# Patient Record
Sex: Female | Born: 1980 | Race: White | Hispanic: No | Marital: Married | Smoking: Never smoker
Health system: Southern US, Community
[De-identification: ages and names within clinical notes are randomized; demographics above are authoritative.]

## PROBLEM LIST (undated history)

## (undated) ENCOUNTER — Inpatient Hospital Stay (HOSPITAL_COMMUNITY): Payer: Self-pay

## (undated) DIAGNOSIS — R87619 Unspecified abnormal cytological findings in specimens from cervix uteri: Secondary | ICD-10-CM

## (undated) DIAGNOSIS — Z8619 Personal history of other infectious and parasitic diseases: Secondary | ICD-10-CM

## (undated) DIAGNOSIS — IMO0002 Reserved for concepts with insufficient information to code with codable children: Secondary | ICD-10-CM

## (undated) DIAGNOSIS — E7211 Homocystinuria: Secondary | ICD-10-CM

## (undated) DIAGNOSIS — E7212 Methylenetetrahydrofolate reductase deficiency: Secondary | ICD-10-CM

## (undated) HISTORY — PX: COLPOSCOPY: SHX161

## (undated) HISTORY — DX: Unspecified abnormal cytological findings in specimens from cervix uteri: R87.619

## (undated) HISTORY — DX: Personal history of other infectious and parasitic diseases: Z86.19

## (undated) HISTORY — DX: Reserved for concepts with insufficient information to code with codable children: IMO0002

---

## 2006-02-06 ENCOUNTER — Other Ambulatory Visit: Admission: RE | Admit: 2006-02-06 | Discharge: 2006-02-06 | Payer: Self-pay | Admitting: Gynecology

## 2008-05-15 ENCOUNTER — Inpatient Hospital Stay (HOSPITAL_COMMUNITY): Admission: AD | Admit: 2008-05-15 | Discharge: 2008-05-18 | Payer: Self-pay | Admitting: Obstetrics & Gynecology

## 2010-03-08 ENCOUNTER — Inpatient Hospital Stay (HOSPITAL_COMMUNITY): Admission: AD | Admit: 2010-03-08 | Discharge: 2010-03-09 | Payer: Self-pay | Admitting: Obstetrics and Gynecology

## 2010-03-19 ENCOUNTER — Inpatient Hospital Stay (HOSPITAL_COMMUNITY): Admission: AD | Admit: 2010-03-19 | Discharge: 2010-03-21 | Payer: Self-pay | Admitting: Obstetrics and Gynecology

## 2010-09-12 LAB — CBC
HCT: 33 % — ABNORMAL LOW (ref 36.0–46.0)
HCT: 36.1 % (ref 36.0–46.0)
Hemoglobin: 11.3 g/dL — ABNORMAL LOW (ref 12.0–15.0)
Hemoglobin: 12.1 g/dL (ref 12.0–15.0)
MCH: 30.6 pg (ref 26.0–34.0)
MCH: 31.4 pg (ref 26.0–34.0)
MCHC: 33.4 g/dL (ref 30.0–36.0)
MCHC: 34.2 g/dL (ref 30.0–36.0)
MCV: 91.7 fL (ref 78.0–100.0)
Platelets: 131 10*3/uL — ABNORMAL LOW (ref 150–400)
RBC: 3.6 MIL/uL — ABNORMAL LOW (ref 3.87–5.11)
RDW: 13.2 % (ref 11.5–15.5)
RDW: 13.4 % (ref 11.5–15.5)
WBC: 12.5 10*3/uL — ABNORMAL HIGH (ref 4.0–10.5)

## 2010-11-12 NOTE — H&P (Signed)
Hannah Holmes, Hannah Holmes                 ACCOUNT NO.:  [id number]   MEDICAL RECORD NO.:  [id number]         PATIENT TYPE:  INP   LOCATION:                                FACILITY:  [facility]   PHYSICIAN:  Freddy Finner, M.D.   DATE OF BIRTH:  Jun 11, 1981   DATE OF ADMISSION:  05/15/2008  DATE OF DISCHARGE:                              HISTORY & PHYSICAL   ADMISSION DIAGNOSIS:  1. Intrauterine pregnancy 39-3/[redacted] weeks gestation.  2. Pregnancy-induced hypertension.   The patient is a 30 year old white married female primigravida whose  prenatal course has been essentially uneventful until November 13 at  which time she was found to have an elevation of her blood pressure  138/90.  She had 1+ proteinuria.  PIH labs obtained on that day showed  only a slight elevation of uric acid and a slight decrease in albumin to  3.4.  When she presented to the office again today her blood pressure is  140/106 with 1+ proteinuria.  Her review of systems was negative but she  has a relatively favorable cervix and clearly has early pregnancy-  induced hypertension.  She is admitted now for delivery.   REVIEW OF SYSTEMS:  She specifically denies any cardiopulmonary or GI  symptoms.  On bed rest over the weekend she lost 4 pounds.  Her prenatal  is otherwise unremarkable.  Her strep culture is negative.   PAST MEDICAL HISTORY:  Family history recorded in detail in the house  [place] form and will not be repeated.   PHYSICAL EXAMINATION:  HEENT: Is grossly within normal limits.  CONSTITUTIONAL:  The patient is alert, oriented, cooperative and has no  complaints.  VITAL SIGNS:  Blood pressure in the office 140/106.  NEUROLOGIC:  Deep tendon reflexes +1.  HEART:  Normal sinus rhythm with an early systolic murmur consistent  with a flow murmur grade 2/6.  CHEST:  Clear to auscultation throughout.  ABDOMEN:  Gravid.  Fundal height 39 cm.  Palpation of the upper abdomen  is remarkable for no evidence of  organomegaly or palpable masses or  tenderness or pain.  EXTREMITIES:  +1 edema.  PELVIC:  Exam of the cervix is long, dilated 1 to 1-1/2 of the internal  os.  Vertex is presenting at a -3 station.   DIAGNOSES:  Intrauterine pregnancy at term, pregnancy-induced  hypertension.   PLAN:  Admission for observation and augmentation of labor to delivery.      Freddy Finner, M.D.  Electronically Signed    WRN/MEDQ  D:  05/15/2008  T:  05/15/2008  Job:  [id number]

## 2011-04-02 LAB — COMPREHENSIVE METABOLIC PANEL
ALT: 17
ALT: 19
AST: 23
AST: 31
Albumin: 3 — ABNORMAL LOW
CO2: 18 — ABNORMAL LOW
Calcium: 9.2
Chloride: 106
Creatinine, Ser: 0.67
Creatinine, Ser: 0.73
GFR calc Af Amer: >60
GFR calc Af Amer: >60
GFR calc non Af Amer: >60
GFR calc non Af Amer: >60
Glucose, Bld: 118 — ABNORMAL HIGH
Sodium: 136
Total Bilirubin: 0.7
Total Protein: 6.3

## 2011-04-02 LAB — CBC
Hemoglobin: 12.1
Hemoglobin: 12.1
MCHC: 33.2
MCHC: 33.5
MCHC: 33.8
MCV: 89.6
MCV: 90.7
Platelets: 181
Platelets: 213
RBC: 4.05
RDW: 14.2
WBC: 15.4 — ABNORMAL HIGH
WBC: 20.9 — ABNORMAL HIGH

## 2011-04-02 LAB — RPR: RPR Ser Ql: NONREACTIVE

## 2011-04-02 LAB — LACTATE DEHYDROGENASE: LDH: 170

## 2011-05-07 ENCOUNTER — Other Ambulatory Visit: Payer: Self-pay

## 2011-05-07 ENCOUNTER — Emergency Department (HOSPITAL_COMMUNITY)
Admission: EM | Admit: 2011-05-07 | Discharge: 2011-05-07 | Disposition: A | Discharge status: Home / Self Care | Payer: BC Managed Care – PPO | Attending: Emergency Medicine | Admitting: Emergency Medicine

## 2011-05-07 ENCOUNTER — Encounter: Payer: Self-pay | Admitting: *Deleted

## 2011-05-07 DIAGNOSIS — R000 Tachycardia, unspecified: Secondary | ICD-10-CM | POA: Insufficient documentation

## 2011-05-07 DIAGNOSIS — R209 Unspecified disturbances of skin sensation: Secondary | ICD-10-CM | POA: Insufficient documentation

## 2011-05-07 DIAGNOSIS — F411 Generalized anxiety disorder: Secondary | ICD-10-CM | POA: Insufficient documentation

## 2011-05-07 DIAGNOSIS — X58XXXA Exposure to other specified factors, initial encounter: Secondary | ICD-10-CM | POA: Insufficient documentation

## 2011-05-07 DIAGNOSIS — R404 Transient alteration of awareness: Secondary | ICD-10-CM | POA: Insufficient documentation

## 2011-05-07 DIAGNOSIS — R42 Dizziness and giddiness: Secondary | ICD-10-CM | POA: Insufficient documentation

## 2011-05-07 DIAGNOSIS — R55 Syncope and collapse: Secondary | ICD-10-CM

## 2011-05-07 DIAGNOSIS — R61 Generalized hyperhidrosis: Secondary | ICD-10-CM | POA: Insufficient documentation

## 2011-05-07 DIAGNOSIS — R110 Nausea: Secondary | ICD-10-CM | POA: Insufficient documentation

## 2011-05-07 DIAGNOSIS — H539 Unspecified visual disturbance: Secondary | ICD-10-CM | POA: Insufficient documentation

## 2011-05-07 DIAGNOSIS — IMO0002 Reserved for concepts with insufficient information to code with codable children: Secondary | ICD-10-CM | POA: Insufficient documentation

## 2011-05-07 LAB — GLUCOSE, CAPILLARY: Glucose-Capillary: 141 mg/dL — ABNORMAL HIGH (ref 70–99)

## 2011-05-07 NOTE — ED Notes (Signed)
EMS-pt had episode of syncope after hyperventilating at work. Alert and oriented x 3. Pt head nose, small abrasion noted.

## 2011-05-07 NOTE — ED Provider Notes (Signed)
History     CSN: [id number] Arrival date & time: 05/07/2011 10:30 AM   First MD Initiated Contact with Patient 05/07/11 1035      Chief Complaint  Patient presents with  . Loss of Consciousness    (Consider location/radiation/quality/duration/timing/severity/associated sxs/prior treatment) Patient is a 30 y.o. female presenting with syncope. The history is provided by the patient.  Loss of Consciousness This is a new problem. The current episode started today. Associated symptoms include diaphoresis, nausea, numbness and a visual change. Pertinent negatives include no abdominal pain, anorexia, arthralgias, change in bowel habit, chest pain, chills, congestion, coughing, fatigue, fever, headaches, neck pain, rash, sore throat, swollen glands, urinary symptoms, vertigo, vomiting or weakness. She has tried eating for the symptoms. The treatment provided moderate relief.   Patient was at work this morning when she had a brief episode of syncope. She states that she did not eat breakfast this morning because she was feeling nauseated. She began to feel dizzy, got tingling in her extremities, noticed clammy palms, and then noticed her vision was closing in. She then lost consciousness for a few seconds. She came back to and then had any another brief episode of syncope which lasted another few seconds. She then ate a few bites of a pear and some glucose paste and began to feel better. She was alert and oriented immediately after the event, and witnesses did not notice any seizure like movements during the event. She was a little shaky after the event. EMS was called and she was brought to the ED for further evaluation. CBG by EMS was 87. She has not had any problems with syncope in the past. She denies any associated chest pain or palpitations. She does recall having some slight nausea with the event, but is now resolved.   She is not currently pregnant. Denies CP, leg swelling, recent travel, recent  surgery, recent immobilization, use of OCPs, personal or family hx of PE/DVT.  No past medical history on file.  No past surgical history on file.  No family history on file.  History  Substance Use Topics  . Smoking status: Never Smoker   . Smokeless tobacco: Never Used  . Alcohol Use: Yes    OB History    Grav Para Term Preterm Abortions TAB SAB Ect Mult Living                  Review of Systems  Constitutional: Positive for diaphoresis. Negative for fever, chills, activity change, appetite change and fatigue.  HENT: Negative for congestion, sore throat and neck pain.   Eyes: Negative for photophobia and visual disturbance.  Respiratory: Negative for cough.   Cardiovascular: Positive for syncope. Negative for chest pain.  Gastrointestinal: Positive for nausea. Negative for vomiting, abdominal pain, anorexia and change in bowel habit.  Musculoskeletal: Negative for arthralgias.  Skin: Negative for rash.  Neurological: Positive for syncope and numbness. Negative for dizziness, vertigo, seizures, speech difficulty, weakness, light-headedness and headaches.  Psychiatric/Behavioral: The patient is not nervous/anxious.     Allergies  Review of patient's allergies indicates no known allergies.  Home Medications  No current outpatient prescriptions on file.  Pulse 90  Temp(Src) 98.2 F (36.8 C) (Oral)  Resp 12  SpO2 95%  Physical Exam  Constitutional: She is oriented to person, place, and time. She appears well-developed and well-nourished. No distress.  HENT:  Head: Normocephalic and atraumatic.  Right Ear: External ear normal.  Left Ear: External ear normal.  Nose: Nose normal.  Mouth/Throat: Oropharynx is clear and moist. No oropharyngeal exudate.       Small abrasion to bridge of nose  Eyes: Conjunctivae and EOM are normal. Pupils are equal, round, and reactive to light.  Neck: Normal range of motion. Neck supple.  Cardiovascular: Normal rate, regular rhythm and  normal heart sounds.  Exam reveals no gallop and no friction rub.   No murmur heard. Pulmonary/Chest: Effort normal and breath sounds normal. No respiratory distress. She exhibits no tenderness.  Abdominal: Soft. There is no tenderness. There is no rebound and no guarding.  Musculoskeletal: Normal range of motion.  Neurological: She is alert and oriented to person, place, and time. No cranial nerve deficit. Coordination normal.  Skin: Skin is warm and dry. No rash noted. She is not diaphoretic.  Psychiatric: She has a normal mood and affect.    ED Course  Procedures (including critical care time)  11:20 AM Patient resting comfortably at this time, states symptoms are resolved - not having any more shakiness. She has been eating crackers and drinking Coke. She feels a little bit anxious and is slightly tachy to 105 but attributes that to the caffeine in the Coke. Denies headache, dizziness. Await urine pregnancy test.   Date: 05/07/2011  Rate: 108  Rhythm: sinus tachycardia  QRS Axis: normal  Intervals: normal  ST/T Wave abnormalities: normal  Conduction Disutrbances:none  Narrative Interpretation:   Old EKG Reviewed: none available    Labs Reviewed  GLUCOSE, CAPILLARY - Abnormal; Notable for the following:    Glucose-Capillary 141 (*)    All other components within normal limits  POCT PREGNANCY, URINE  POCT CBG MONITORING  POCT PREGNANCY, URINE   No results found.   1. Syncope       MDM  This sounds as if it may have been a vasovagal episode possibly mediated by hypoglycemia. Her sx have resolved while in the dept. She has been able to take PO well without n/v. Her CBG in the dept was nl after eating. She is not currently pregnant. She was a little tachycardic initially but vitals have normalized. She denies CP, SOB, palpitations and does not otherwise meet PERC criteria, so I do not suspect PE at this time. Findings and plan explained to pt. She was educated regarding  warning signs that would prompt a return visit to the ED. She verbalized understanding and agreed to plan.  Grant Fontana, [place] 05/07/11 1216

## 2011-05-07 NOTE — ED Provider Notes (Addendum)
Medical screening examination/treatment/procedure(s) were conducted as a shared visit with non-physician practitioner(s) and myself.  I personally evaluated the patient during the encounter  Patient PERC negative except mild tachycardia on arrival during EKG. She is no longer tachycardic without intervention in the Emergency Dept. She is feeling back to baseline at this time. RRR no m/r/g. Lung CTAB.   Forbes Cellar, MD 05/07/11 1240   I was not notified by ED staff of abnl vitals prior to discharge HR 120--> 103. During my last evaluation of the patient, she felt well as above with HR 98 on monitor. BP 107/55  Pulse 103  Temp(Src) 98.2 F (36.8 C) (Oral)  Resp 16  SpO2 98%   Forbes Cellar, MD 05/08/11 2157

## 2011-05-07 NOTE — ED Notes (Signed)
Pt reports she was at work when she started feeling dizzy. States she sat down at a co-workers desk and had syncopal episode. Pt states she then got up and walked to get something to eat, states she hadn't ate yet today. Pt states she then had another syncopal episode. Pt reports she did have headache but it is gone now. Pt with abrasion to top of nose. Pt is alert and oriented x 3. Neuro intact. Pts only complaint at this time is she is cold.

## 2012-03-17 LAB — OB RESULTS CONSOLE RUBELLA ANTIBODY, IGM: Rubella: IMMUNE

## 2012-03-17 LAB — OB RESULTS CONSOLE ABO/RH

## 2012-03-17 LAB — OB RESULTS CONSOLE HEPATITIS B SURFACE ANTIGEN: Hepatitis B Surface Ag: NEGATIVE

## 2012-03-17 LAB — OB RESULTS CONSOLE ANTIBODY SCREEN: Antibody Screen: NEGATIVE

## 2012-04-02 LAB — OB RESULTS CONSOLE GC/CHLAMYDIA
Chlamydia: NEGATIVE
Gonorrhea: NEGATIVE

## 2012-10-26 ENCOUNTER — Inpatient Hospital Stay (HOSPITAL_COMMUNITY)
Admission: AD | Admit: 2012-10-26 | Discharge: 2012-10-26 | Disposition: A | Discharge status: Home / Self Care | Payer: BC Managed Care – PPO | Source: Ambulatory Visit | Attending: Obstetrics and Gynecology | Admitting: Obstetrics and Gynecology

## 2012-10-26 ENCOUNTER — Encounter (HOSPITAL_COMMUNITY): Payer: Self-pay | Admitting: Obstetrics and Gynecology

## 2012-10-26 DIAGNOSIS — N949 Unspecified condition associated with female genital organs and menstrual cycle: Secondary | ICD-10-CM | POA: Insufficient documentation

## 2012-10-26 DIAGNOSIS — O479 False labor, unspecified: Secondary | ICD-10-CM | POA: Insufficient documentation

## 2012-10-26 NOTE — MAU Note (Signed)
Hannah Holmes is here today with complaints of contractions and pelvic pressure. She is 40w, says she was checked in the office on Thursday and her cervix was 3-4 cm dilated. No bleeding or gush or fluid.

## 2012-10-27 ENCOUNTER — Encounter (HOSPITAL_COMMUNITY): Payer: Self-pay | Admitting: *Deleted

## 2012-10-27 ENCOUNTER — Telehealth (HOSPITAL_COMMUNITY): Payer: Self-pay | Admitting: *Deleted

## 2012-10-27 NOTE — Telephone Encounter (Signed)
Preadmission screen  

## 2012-11-01 ENCOUNTER — Inpatient Hospital Stay (HOSPITAL_COMMUNITY)
Admission: AD | Admit: 2012-11-01 | Discharge: 2012-11-02 | DRG: 373 | Disposition: A | Discharge status: Home / Self Care | Payer: BC Managed Care – PPO | Source: Ambulatory Visit | Attending: Obstetrics and Gynecology | Admitting: Obstetrics and Gynecology

## 2012-11-01 ENCOUNTER — Encounter (HOSPITAL_COMMUNITY): Payer: Self-pay | Admitting: Anesthesiology

## 2012-11-01 ENCOUNTER — Encounter (HOSPITAL_COMMUNITY): Payer: Self-pay | Admitting: *Deleted

## 2012-11-01 LAB — TYPE AND SCREEN
ABO/RH(D): O POS
Antibody Screen: NEGATIVE

## 2012-11-01 LAB — POCT FERN TEST: POCT Fern Test: POSITIVE

## 2012-11-01 LAB — CBC
HCT: 36.2 % (ref 36.0–46.0)
MCV: 88.3 fL (ref 78.0–100.0)
Platelets: 158 10*3/uL (ref 150–400)
RBC: 4.1 MIL/uL (ref 3.87–5.11)
WBC: 11.6 10*3/uL — ABNORMAL HIGH (ref 4.0–10.5)

## 2012-11-01 LAB — ABO/RH: ABO/RH(D): O POS

## 2012-11-01 MED ORDER — ONDANSETRON HCL 4 MG/2ML IJ SOLN: 4.0000 mg | Freq: Four times a day (QID) | INTRAMUSCULAR | Status: DC | PRN

## 2012-11-01 MED ORDER — PHENYLEPHRINE 40 MCG/ML (10ML) SYRINGE FOR IV PUSH (FOR BLOOD PRESSURE SUPPORT)
80.0000 ug | PREFILLED_SYRINGE | INTRAVENOUS | Status: DC | PRN
  Filled 2012-11-01: qty 2

## 2012-11-01 MED ORDER — LANOLIN HYDROUS EX OINT: TOPICAL_OINTMENT | CUTANEOUS | Status: DC | PRN

## 2012-11-01 MED ORDER — BENZOCAINE-MENTHOL 20-0.5 % EX AERO
1.0000 "application " | INHALATION_SPRAY | CUTANEOUS | Status: DC | PRN
  Administered 2012-11-01: 1 via TOPICAL
  Filled 2012-11-01: qty 56

## 2012-11-01 MED ORDER — PRENATAL MULTIVITAMIN CH
1.0000 | ORAL_TABLET | Freq: Every day | ORAL | Status: DC
  Administered 2012-11-01: 1 via ORAL
  Filled 2012-11-01: qty 1

## 2012-11-01 MED ORDER — WITCH HAZEL-GLYCERIN EX PADS: 1.0000 "application " | MEDICATED_PAD | CUTANEOUS | Status: DC | PRN

## 2012-11-01 MED ORDER — FENTANYL 2.5 MCG/ML BUPIVACAINE 1/10 % EPIDURAL INFUSION (WH - ANES)
14.0000 mL/h | INTRAMUSCULAR | Status: DC | PRN
  Filled 2012-11-01: qty 125

## 2012-11-01 MED ORDER — FLEET ENEMA 7-19 GM/118ML RE ENEM: 1.0000 | ENEMA | Freq: Once | RECTAL | Status: DC

## 2012-11-01 MED ORDER — CITRIC ACID-SODIUM CITRATE 334-500 MG/5ML PO SOLN: 30.0000 mL | ORAL | Status: DC | PRN

## 2012-11-01 MED ORDER — DIBUCAINE 1 % RE OINT: 1.0000 "application " | TOPICAL_OINTMENT | RECTAL | Status: DC | PRN

## 2012-11-01 MED ORDER — PROMETHAZINE HCL 25 MG/ML IJ SOLN
12.5000 mg | Freq: Four times a day (QID) | INTRAMUSCULAR | Status: DC | PRN
  Administered 2012-11-01: 12.5 mg via INTRAVENOUS
  Filled 2012-11-01: qty 1

## 2012-11-01 MED ORDER — NALOXONE HCL 0.4 MG/ML IJ SOLN
INTRAMUSCULAR | Status: AC
  Filled 2012-11-01: qty 1

## 2012-11-01 MED ORDER — LACTATED RINGERS IV SOLN: 500.0000 mL | Freq: Once | INTRAVENOUS | Status: DC

## 2012-11-01 MED ORDER — ONDANSETRON HCL 4 MG/2ML IJ SOLN: 4.0000 mg | INTRAMUSCULAR | Status: DC | PRN

## 2012-11-01 MED ORDER — SENNOSIDES-DOCUSATE SODIUM 8.6-50 MG PO TABS
2.0000 | ORAL_TABLET | Freq: Every day | ORAL | Status: DC
  Administered 2012-11-01: 2 via ORAL

## 2012-11-01 MED ORDER — SIMETHICONE 80 MG PO CHEW: 80.0000 mg | CHEWABLE_TABLET | ORAL | Status: DC | PRN

## 2012-11-01 MED ORDER — ONDANSETRON HCL 4 MG PO TABS: 4.0000 mg | ORAL_TABLET | ORAL | Status: DC | PRN

## 2012-11-01 MED ORDER — LACTATED RINGERS IV SOLN: 500.0000 mL | INTRAVENOUS | Status: DC | PRN

## 2012-11-01 MED ORDER — OXYTOCIN BOLUS FROM INFUSION: 500.0000 mL | INTRAVENOUS | Status: DC

## 2012-11-01 MED ORDER — PHENYLEPHRINE 40 MCG/ML (10ML) SYRINGE FOR IV PUSH (FOR BLOOD PRESSURE SUPPORT)
80.0000 ug | PREFILLED_SYRINGE | INTRAVENOUS | Status: DC | PRN
  Filled 2012-11-01: qty 2
  Filled 2012-11-01: qty 5

## 2012-11-01 MED ORDER — IBUPROFEN 600 MG PO TABS
600.0000 mg | ORAL_TABLET | Freq: Four times a day (QID) | ORAL | Status: DC | PRN
  Administered 2012-11-01: 600 mg via ORAL
  Filled 2012-11-01: qty 1

## 2012-11-01 MED ORDER — DIPHENHYDRAMINE HCL 25 MG PO CAPS: 25.0000 mg | ORAL_CAPSULE | Freq: Four times a day (QID) | ORAL | Status: DC | PRN

## 2012-11-01 MED ORDER — TETANUS-DIPHTH-ACELL PERTUSSIS 5-2.5-18.5 LF-MCG/0.5 IM SUSP: 0.5000 mL | Freq: Once | INTRAMUSCULAR | Status: DC

## 2012-11-01 MED ORDER — BUTORPHANOL TARTRATE 1 MG/ML IJ SOLN
INTRAMUSCULAR | Status: AC
  Filled 2012-11-01: qty 1

## 2012-11-01 MED ORDER — ACETAMINOPHEN 325 MG PO TABS: 650.0000 mg | ORAL_TABLET | ORAL | Status: DC | PRN

## 2012-11-01 MED ORDER — OXYTOCIN 40 UNITS IN LACTATED RINGERS INFUSION - SIMPLE MED
62.5000 mL/h | INTRAVENOUS | Status: DC
  Administered 2012-11-01: 62.5 mL/h via INTRAVENOUS
  Filled 2012-11-01: qty 1000

## 2012-11-01 MED ORDER — EPHEDRINE 5 MG/ML INJ
10.0000 mg | INTRAVENOUS | Status: DC | PRN
Start: 2012-11-01 — End: 2012-11-01
  Filled 2012-11-01: qty 2
  Filled 2012-11-01: qty 4

## 2012-11-01 MED ORDER — LACTATED RINGERS IV SOLN: INTRAVENOUS | Status: DC

## 2012-11-01 MED ORDER — EPHEDRINE 5 MG/ML INJ
10.0000 mg | INTRAVENOUS | Status: DC | PRN
  Filled 2012-11-01: qty 2

## 2012-11-01 MED ORDER — MEASLES, MUMPS & RUBELLA VAC ~~LOC~~ INJ
0.5000 mL | INJECTION | Freq: Once | SUBCUTANEOUS | Status: DC
  Filled 2012-11-01: qty 0.5

## 2012-11-01 MED ORDER — OXYCODONE-ACETAMINOPHEN 5-325 MG PO TABS
1.0000 | ORAL_TABLET | ORAL | Status: DC | PRN
  Administered 2012-11-01: 1 via ORAL
  Filled 2012-11-01: qty 1

## 2012-11-01 MED ORDER — IBUPROFEN 600 MG PO TABS
600.0000 mg | ORAL_TABLET | Freq: Four times a day (QID) | ORAL | Status: DC
  Administered 2012-11-01 – 2012-11-02 (×4): 600 mg via ORAL
  Filled 2012-11-01 (×4): qty 1

## 2012-11-01 MED ORDER — OXYCODONE-ACETAMINOPHEN 5-325 MG PO TABS
1.0000 | ORAL_TABLET | ORAL | Status: DC | PRN
  Administered 2012-11-02: 1 via ORAL
  Filled 2012-11-01: qty 1

## 2012-11-01 MED ORDER — DIPHENHYDRAMINE HCL 50 MG/ML IJ SOLN: 12.5000 mg | INTRAMUSCULAR | Status: DC | PRN

## 2012-11-01 MED ORDER — MEDROXYPROGESTERONE ACETATE 150 MG/ML IM SUSP: 150.0000 mg | INTRAMUSCULAR | Status: DC | PRN

## 2012-11-01 MED ORDER — BUTORPHANOL TARTRATE 1 MG/ML IJ SOLN
1.0000 mg | Freq: Once | INTRAMUSCULAR | Status: AC
  Administered 2012-11-01: 1 mg via INTRAVENOUS

## 2012-11-01 MED ORDER — LIDOCAINE HCL (PF) 1 % IJ SOLN
30.0000 mL | INTRAMUSCULAR | Status: DC | PRN
  Administered 2012-11-01: 30 mL via SUBCUTANEOUS
  Filled 2012-11-01 (×2): qty 30

## 2012-11-01 NOTE — Plan of Care (Signed)
Problem: Consults Goal: Birthing Suites Patient Information Press F2 to bring up selections list Outcome: Completed/Met Date Met:  11/01/12  Pt > [redacted] weeks EGA

## 2012-11-01 NOTE — Progress Notes (Signed)
Pt quickly progressed from 8cm to c/c/+2 station.  I was called for delivery & upon my arrival infant head and shoulders delivered by RN.  I delivered abdomen/LE.  Mouth and nose suctioned and cord was clamped and cut.  Infant was placed on mom's chest but poor respiratory effort noted (mom received stadol/phenergan approx 30-40 min prior to delivery).  Infant taken by RN to warmer, stimulation & narcan given.  Infant responded quickly w/ good cry and tone.  Apgars 7,8.  Placenta delivered spontaneously to vagina and then grasped from vagina and removed.  2nd degree laceration repaired with 3-0 vicryl rapide.  Fundus firm.  Mom and infant doing well.

## 2012-11-01 NOTE — Anesthesia Preprocedure Evaluation (Deleted)

## 2012-11-01 NOTE — H&P (Signed)
32 yo G3P2 @ 40+5 weeks presents w/ labor.  Past history - see [facility], SVD x 2, GBS neg  Af, VSS + FHT, reassuring Toco - Q2-4 Gen - NAD Abd - gravid, NT  EFW 8# Ext - NT PV - 5cm  A/P:  Labor Exp mngt Epidural prn

## 2012-11-01 NOTE — Progress Notes (Signed)
Pt very uncomfortable w/ contractions.  Requesting IV pain meds   FHT reassuring toco Q2 Cvx 6/C/0  A/P:  IV stadol Will have narcan in room in case of rapid delivery Exp mngt

## 2012-11-02 LAB — CBC
MCHC: 33 g/dL (ref 30.0–36.0)
Platelets: 141 10*3/uL — ABNORMAL LOW (ref 150–400)
RDW: 13.5 % (ref 11.5–15.5)
WBC: 15.5 10*3/uL — ABNORMAL HIGH (ref 4.0–10.5)

## 2012-11-02 MED ORDER — IBUPROFEN 600 MG PO TABS: 600.0000 mg | ORAL_TABLET | Freq: Four times a day (QID) | ORAL | Status: DC

## 2012-11-02 MED ORDER — OXYCODONE-ACETAMINOPHEN 5-325 MG PO TABS: 1.0000 | ORAL_TABLET | ORAL | Status: DC | PRN

## 2012-11-02 NOTE — Discharge Summary (Signed)
Obstetric Discharge Summary Reason for Admission: onset of labor Prenatal Procedures: ultrasound Intrapartum Procedures: spontaneous vaginal delivery Postpartum Procedures: none Complications-Operative and Postpartum: 2 degree perineal laceration Hemoglobin  Date Value Range Status  11/02/2012 10.5* 12.0 - 15.0 g/dL Final     HCT  Date Value Range Status  11/02/2012 31.8* 36.0 - 46.0 % Final    Physical Exam:  General: alert and cooperative Lochia: appropriate Uterine Fundus: firm Incision: perineum intact DVT Evaluation: No evidence of DVT seen on physical exam. Negative Homan's sign. No cords or calf tenderness. No significant calf/ankle edema.  Discharge Diagnoses: Term Pregnancy-delivered  Discharge Information: Date: 11/02/2012 Activity: pelvic rest Diet: routine Medications: PNV, Ibuprofen and Percocet Condition: stable Instructions: refer to practice specific booklet Discharge to: home   Newborn Data: Live born female  Birth Weight: 8 lb 11.5 oz (3955 g) APGAR: 7, 8  Home with mother.  Hannah Holmes G 11/02/2012, 8:29 AM

## 2012-11-02 NOTE — Progress Notes (Signed)
Post Partum Day 1 Subjective: no complaints, up ad lib, voiding, tolerating PO and + flatus  Objective: Blood pressure 106/66, pulse 92, temperature 98 F (36.7 C), temperature source Oral, resp. rate 18, height 5\' 4"  (1.626 m), weight 185 lb (83.915 kg), SpO2 97.00%, unknown if currently breastfeeding.  Physical Exam:  General: alert and cooperative Lochia: appropriate Uterine Fundus: firm Incision: perineum intact DVT Evaluation: No evidence of DVT seen on physical exam. Negative Homan's sign. No cords or calf tenderness. No significant calf/ankle edema.   Recent Labs  11/01/12 0330 11/02/12 0600  HGB 11.8* 10.5*  HCT 36.2 31.8*    Assessment/Plan: Discharge home and Circumcision prior to discharge   LOS: 1 day   Hannah Holmes G 11/02/2012, 8:18 AM

## 2012-11-03 ENCOUNTER — Inpatient Hospital Stay (HOSPITAL_COMMUNITY): Admission: RE | Admit: 2012-11-03 | Payer: BC Managed Care – PPO | Source: Ambulatory Visit

## 2014-05-01 ENCOUNTER — Encounter (HOSPITAL_COMMUNITY): Payer: Self-pay | Admitting: *Deleted

## 2014-08-15 ENCOUNTER — Inpatient Hospital Stay (HOSPITAL_COMMUNITY): Payer: No Typology Code available for payment source

## 2014-08-15 ENCOUNTER — Inpatient Hospital Stay (HOSPITAL_COMMUNITY)
Admission: AD | Admit: 2014-08-15 | Discharge: 2014-08-15 | Disposition: A | Discharge status: Home / Self Care | Payer: No Typology Code available for payment source | Source: Ambulatory Visit | Attending: Obstetrics and Gynecology | Admitting: Obstetrics and Gynecology

## 2014-08-15 ENCOUNTER — Encounter (HOSPITAL_COMMUNITY): Payer: Self-pay | Admitting: *Deleted

## 2014-08-15 DIAGNOSIS — Z5320 Procedure and treatment not carried out because of patient's decision for unspecified reasons: Secondary | ICD-10-CM | POA: Diagnosis not present

## 2014-08-15 DIAGNOSIS — R58 Hemorrhage, not elsewhere classified: Secondary | ICD-10-CM

## 2014-08-15 DIAGNOSIS — O209 Hemorrhage in early pregnancy, unspecified: Secondary | ICD-10-CM | POA: Diagnosis present

## 2014-08-15 NOTE — MAU Provider Note (Signed)
Hannah Holmes is a 34 y.o. G4P3003 at 10.3 weeks per [place].  Pt arrived from the office at for bleeding eval.  She was brought into triage at 1810.  She stated she expected to be taken, seen, ultra sound done and evaluated right away.  She was informed that the MAU was full and there would be a slight wait.  US orders was put in but she reported she needed to pick up her other kids at 1900.  She was informed that most likely she would not be complete by 1900.  She said "then I will just leave and go back to the office tomorrow".  I informed her she was welcome to come back any time after she picked up the kids.  Again she said she was just going to the office in the morning.  I informed her she MUST call to make sure there was an available appointment in the office tomorrow.  She wanted to make sure she or her insurance was NOT going to be charged for this visit.  Pt left without being evaluated.   History     There are no active problems to display for this patient.   Chief Complaint  Patient presents with  . Vaginal Bleeding   HPI  OB History    Gravida Hannah Term Preterm AB TAB SAB Ectopic Multiple Living   4 3 3  0 0 0 0 0 0 3      Past Medical History  Diagnosis Date  . Medical history non-contributory   . Hx of varicella   . Abnormal Pap smear     Past Surgical History  Procedure Laterality Date  . Colposcopy      No family history on file.  History  Substance Use Topics  . Smoking status: Never Smoker   . Smokeless tobacco: Never Used  . Alcohol Use: Yes    Allergies: No Known Allergies  No prescriptions prior to admission    ROS See HPI above, all other systems are negative  Physical Exam   Blood pressure 128/82, pulse 78, temperature 98.2 F (36.8 C), temperature source Oral, resp. rate 18, last menstrual period 06/05/2014, unknown if currently breastfeeding.  Physical Exam deferred  ED Course  Assessment: deferred   Plan:  deferred   Orpah Hausner, CNM, MSN 08/15/2014. 8:01 PM

## 2014-08-15 NOTE — MAU Note (Signed)
Sent from office for [place]

## 2015-06-20 ENCOUNTER — Encounter (HOSPITAL_COMMUNITY): Payer: Self-pay | Admitting: *Deleted

## 2015-08-02 ENCOUNTER — Ambulatory Visit (INDEPENDENT_AMBULATORY_CARE_PROVIDER_SITE_OTHER): Payer: No Typology Code available for payment source | Admitting: Gynecology

## 2015-08-02 ENCOUNTER — Ambulatory Visit (INDEPENDENT_AMBULATORY_CARE_PROVIDER_SITE_OTHER): Payer: No Typology Code available for payment source

## 2015-08-02 ENCOUNTER — Encounter: Payer: Self-pay | Admitting: Gynecology

## 2015-08-02 VITALS — BP 120/64 | Ht 64.0 in | Wt 169.0 lb

## 2015-08-02 DIAGNOSIS — O021 Missed abortion: Secondary | ICD-10-CM

## 2015-08-02 NOTE — Patient Instructions (Signed)
Follow up after ultrasound

## 2015-08-02 NOTE — Progress Notes (Addendum)
Gurbani Figge Korber 07/30/1980 [id number]        35 y.o.  [id number] new GYN presents at 12 weeks by dates who had a ultrasound done at another facility where they reported the pregnancy to be nonviable. She is in between obstetrical practices and has not been seen yet for this pregnancy. She has no bleeding or cramping. Had similar episode earlier this year with early spontaneous loss. Otherwise has 3 full-term normal pregnancies. No precipitating events as far as exposures or trauma. Patient's blood type is O+ as documented in Epic.  Past medical history,surgical history, problem list, medications, allergies, family history and social history were all reviewed and documented in the EPIC chart.  Directed ROS with pertinent positives and negatives documented in the history of present illness/assessment and plan.  Exam: Kennon Portela assistant Filed Vitals:   08/02/15 1602  BP: 120/64  Height:  (1.626 m)  Weight: 169 lb (76.658 kg)   General appearance:  Normal HEENT normal Lungs clear Cardiac regular rate no rubs murmurs or gallops Abdomen soft nontender without masses guarding rebound Pelvic external BUS vagina normal. Cervix normal and closed. Uterus approximately 8 week size soft midline mobile nontender. Adnexa without masses or tenderness  Assessment/Plan:  35 y.o. [id number] with presumed missed AB, size less than dates. Will check viability ultrasound now to confirm. Options for management reviewed. Patient elected for expectant management with her last missed AB and had a spontaneous completed AB. She is tending towards this for this episode. Alternatives to include D&C reviewed and offered. I reviewed with involved with a suction D&C to include the expected intraoperative and postoperative courses. Risks to include infection, prolonged antibiotics, hemorrhage necessitating transfusion and the risks of transfusion including transfusion reaction, hepatitis, HIV, mad cow disease and other unknown  entities were reviewed. Risk of uterine perforation particularly with early pregnant uterus leading to damage to internal organs including bowel, bladder, ureters, vessels, nerves necessitating major exploratory reparative surgeries and future reparative surgeries including bowel resection, ostomy formation, bladder repair, ureteral damage repair, vascular repair was all discussed, understood and accepted. The patient's questions were answered to her satisfaction and she will follow up after her ultrasound.  I also reviewed with her that she now has had to miss abortions along with 3 full-term pregnancies. The issue as to when to start a workup for habitual abortion discussed. Given her situation we both agree not to initiate a workup at this point but if she would have further miscarriages to pursue an evaluation that point.  Addendum: Patient had ultrasound this afternoon which unfortunately confirmed a nonviable gestation measuring 10 weeks by ultrasound. This places her size less than dates by approximately 3 weeks. 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  Reviewed findings with the patient. At this point the patient prefers expectant management with hopeful spontaneous AB. I also reviewed the options for additional medication such as cytotech. At this point the patient declines this.  Precautions reviewed for ASAP call. I asked her regardless to touch bases with [place] after the weekend to see how she is doing and if she has not had a spontaneous AB by then that we would move towards scheduling the D&C. I also reviewed with her if she does have a spontaneous AB that we will need to follow up with an ultrasound make sure that there is complete emptying of the uterine cavity.  Dara Lords MD, 4:25 PM 08/02/2015

## 2015-08-11 ENCOUNTER — Encounter (HOSPITAL_COMMUNITY): Payer: Self-pay | Admitting: *Deleted

## 2015-08-11 ENCOUNTER — Inpatient Hospital Stay (HOSPITAL_COMMUNITY)
Admission: AD | Admit: 2015-08-11 | Discharge: 2015-08-11 | Disposition: A | Discharge status: Home / Self Care | Payer: PRIVATE HEALTH INSURANCE | Source: Ambulatory Visit | Attending: Obstetrics and Gynecology | Admitting: Obstetrics and Gynecology

## 2015-08-11 ENCOUNTER — Inpatient Hospital Stay (HOSPITAL_COMMUNITY): Payer: PRIVATE HEALTH INSURANCE

## 2015-08-11 DIAGNOSIS — O039 Complete or unspecified spontaneous abortion without complication: Secondary | ICD-10-CM | POA: Insufficient documentation

## 2015-08-11 DIAGNOSIS — N939 Abnormal uterine and vaginal bleeding, unspecified: Secondary | ICD-10-CM | POA: Diagnosis present

## 2015-08-11 DIAGNOSIS — R55 Syncope and collapse: Secondary | ICD-10-CM | POA: Diagnosis present

## 2015-08-11 LAB — CBC
HCT: 35.2 % — ABNORMAL LOW (ref 36.0–46.0)
Hemoglobin: 11.9 g/dL — ABNORMAL LOW (ref 12.0–15.0)
MCH: 30.1 pg (ref 26.0–34.0)
MCHC: 33.8 g/dL (ref 30.0–36.0)
MCV: 88.9 fL (ref 78.0–100.0)
PLATELETS: 254 10*3/uL (ref 150–400)
RBC: 3.96 MIL/uL (ref 3.87–5.11)
RDW: 12.7 % (ref 11.5–15.5)
WBC: 17 10*3/uL — AB (ref 4.0–10.5)

## 2015-08-11 MED ORDER — IBUPROFEN 600 MG PO TABS: 600.0000 mg | ORAL_TABLET | Freq: Four times a day (QID) | ORAL | Status: DC | PRN

## 2015-08-11 MED ORDER — LACTATED RINGERS IV BOLUS (SEPSIS)
1000.0000 mL | Freq: Once | INTRAVENOUS | Status: AC
  Administered 2015-08-11: 1000 mL via INTRAVENOUS

## 2015-08-11 MED ORDER — MISOPROSTOL 200 MCG PO TABS
ORAL_TABLET | ORAL | Status: DC
Start: 2015-08-11 — End: 2016-09-20

## 2015-08-11 MED ORDER — DOXYCYCLINE HYCLATE 100 MG PO CAPS: ORAL_CAPSULE | ORAL | Status: DC

## 2015-08-11 MED ORDER — CEFTRIAXONE SODIUM 250 MG IJ SOLR
250.0000 mg | Freq: Once | INTRAMUSCULAR | Status: AC
  Administered 2015-08-11: 250 mg via INTRAMUSCULAR
  Filled 2015-08-11: qty 250

## 2015-08-11 NOTE — MAU Provider Note (Signed)
History     CSN: [id number]  Arrival date and time: 08/11/15 1043   First Provider Initiated Contact with Patient 08/11/15 1134         Chief Complaint  Patient presents with  . Vaginal Bleeding   HPI  Hannah Holmes is a 35 y.o. female who presents for miscarriage. Diagnosed with missed AB on 2/2 in the office. Fetus measuring 10 weeks per note. Patient chose expectant management. Reports abdominal cramping that felt like contractions all night. Woke up this morning with vaginal bleeding at 5 am. States had heavy vaginal bleeding with clots then passed the fetus. Initially, vaginal bleeding increased but reports improvement in bleeding & resolution of abdominal pain just prior to arriving here.    OB History    Gravida Para Term Preterm AB TAB SAB Ectopic Multiple Living   0 1 0 1 0 0 3      Past Medical History  Diagnosis Date  . Medical history non-contributory   . Hx of varicella   . Abnormal Pap smear     Past Surgical History  Procedure Laterality Date  . Colposcopy      History reviewed. No pertinent family history.  Social History  Substance Use Topics  . Smoking status: Never Smoker   . Smokeless tobacco: Never Used  . Alcohol Use: 0.0 oz/week    0 Standard drinks or equivalent per week     Comment: Social    Allergies: No Known Allergies  Prescriptions prior to admission  Medication Sig Dispense Refill Last Dose  . Prenatal Vit-Fe Fumarate-FA (PRENATAL MULTIVITAMIN) TABS Take 1 tablet by mouth daily at 12 noon.   Taking    Review of Systems  Constitutional: Negative for fever and chills.  Gastrointestinal: Positive for abdominal pain.  Genitourinary:       + vaginal bleeding   Physical Exam   Blood pressure 108/65, pulse 94, temperature 97.4 F (36.3 C), resp. rate 18, last menstrual period 05/03/2015, unknown if currently breastfeeding.  Patient Vitals for the past 24 hrs:  BP Temp Pulse Resp  08/11/15 1322 - 98.6 F (37 C) - -   08/11/15 1234 (!) 115/53 mmHg - 98 -  08/11/15 1233 115/67 mmHg - 108 -  08/11/15 1231 120/67 mmHg - 94 -  08/11/15 1230 120/63 mmHg - 79 -  08/11/15 1113 (!) 72/55 mmHg - 85 -  08/11/15 1111 (!) 87/54 mmHg - 102 -  08/11/15 1110 108/65 mmHg - 92 -  08/11/15 1108 110/72 mmHg - 86 -  08/11/15 1053 108/65 mmHg 97.4 F (36.3 C) 94 18     Physical Exam  Nursing note and vitals reviewed. Constitutional: She is oriented to person, place, and time. She appears well-developed and well-nourished. No distress.  HENT:  Head: Normocephalic and atraumatic.  Eyes: Conjunctivae are normal. Right eye exhibits no discharge. Left eye exhibits no discharge. No scleral icterus.  Neck: Normal range of motion.  Cardiovascular: Normal rate, regular rhythm and normal heart sounds.   No murmur heard. Respiratory: Effort normal and breath sounds normal. No respiratory distress. She has no wheezes.  GI: Soft. Bowel sounds are normal. There is no tenderness.  Genitourinary: Uterus is enlarged and tender.  Cervix dilated 1 cm Several small clots removed from canal in addition to tissue like substance. Tissue protruding from cervical os. Attempted to remove using ring forceps without success.   Neurological: She is alert and oriented to person, place, and time.  Skin:  Skin is warm and dry. She is not diaphoretic.  Psychiatric: She has a normal mood and affect. Her behavior is normal. Judgment and thought content normal.    MAU Course  Procedures Results for orders placed or performed during the hospital encounter of 08/11/15 (from the past 24 hour(s))  CBC     Status: Abnormal   Collection Time: 08/11/15 11:14 AM  Result Value Ref Range   WBC 17.0 (H) 4.0 - 10.5 K/uL   RBC 3.96 3.87 - 5.11 MIL/uL   Hemoglobin 11.9 (L) 12.0 - 15.0 g/dL   HCT 91.4 (L) 78.2 - 95.6 %   MCV 88.9 78.0 - 100.0 fL   MCH 30.1 26.0 - 34.0 pg   MCHC 33.8 30.0 - 36.0 g/dL   RDW 21.3 08.6 - 57.8 %   Platelets 254 150 - 400 K/uL    US Pelvis Complete  08/11/2015  CLINICAL DATA:  35 year old female with heavy vaginal bleeding today. Ultrasound on 08/02/2015 demonstrating fetal demise. EXAM: TRANSABDOMINAL ULTRASOUND OF PELVIS TECHNIQUE: Transabdominal ultrasound examination of the pelvis was performed including evaluation of the uterus, ovaries, adnexal regions, and pelvic cul-de-sac. COMPARISON:  08/02/2015 ultrasound from [facility] gynecology. FINDINGS: Uterus Measurements: 11.6 x 7.1 x 5.7 cm. No fibroids or other mass visualized. Endometrium Thickness: 24 mm and heterogeneously echogenic and could represent retained products. The fetus identified on 08/02/2015 is no longer present. No focal abnormality visualized. Right ovary Measurements: 3.5 x 2.5 x 2.8 cm. Normal appearance/no adnexal mass. Left ovary Measurements: 2.8 x 2.3 x 2.2 cm. Normal appearance/no adnexal mass. Other findings:  No abnormal free fluid. IMPRESSION: No evidence of intrauterine pregnancy and previously identified fetus no longer present. Heterogeneously echogenic and thickened endometrium may represent retained products. No other significant abnormalities. Electronically Signed   By: Harmon Pier M.D.   On: 08/11/2015 12:18    MDM O positive Pt orthostatic - IV fluid bolus ordered Declines pain meds, denies pain at this time Orthostatic VS repeated after she returned from ultrasound, normal & pt asymptomatic.  Initially discussed case with Dr. Oscar La who agreed to ultrasound. Dr. Hyacinth Meeker now on call for practice, discussed results with her & she will come speak with patient regarding plan of care.  Assessment and Plan  A: SAB  Dr. Hyacinth Meeker en route to see patient.    Judeth Horn 08/11/2015, 11:13 AM

## 2015-08-11 NOTE — Discharge Instructions (Signed)
Miscarriage  A miscarriage is the sudden loss of an unborn baby (fetus) before the 20th week of pregnancy. Most miscarriages happen in the first 3 months of pregnancy. Sometimes, it happens before a woman even knows she is pregnant. A miscarriage is also called a "spontaneous miscarriage" or "early pregnancy loss." Having a miscarriage can be an emotional experience. Talk with your caregiver about any questions you may have about miscarrying, the grieving process, and your future pregnancy plans.  CAUSES    Problems with the fetal chromosomes that make it impossible for the baby to develop normally. Problems with the baby's genes or chromosomes are most often the result of errors that occur, by chance, as the embryo divides and grows. The problems are not inherited from the parents.   Infection of the cervix or uterus.    Hormone problems.    Problems with the cervix, such as having an incompetent cervix. This is when the tissue in the cervix is not strong enough to hold the pregnancy.    Problems with the uterus, such as an abnormally shaped uterus, uterine fibroids, or congenital abnormalities.    Certain medical conditions.    Smoking, drinking alcohol, or taking illegal drugs.    Trauma.   Often, the cause of a miscarriage is unknown.   SYMPTOMS    Vaginal bleeding or spotting, with or without cramps or pain.   Pain or cramping in the abdomen or lower back.   Passing fluid, tissue, or blood clots from the vagina.  DIAGNOSIS   Your caregiver will perform a physical exam. You may also have an ultrasound to confirm the miscarriage. Blood or urine tests may also be ordered.  TREATMENT    Sometimes, treatment is not necessary if you naturally pass all the fetal tissue that was in the uterus. If some of the fetus or placenta remains in the body (incomplete miscarriage), tissue left behind may become infected and must be removed. Usually, a dilation and curettage (D and C) procedure is performed.  During a D and C procedure, the cervix is widened (dilated) and any remaining fetal or placental tissue is gently removed from the uterus.   Antibiotic medicines are prescribed if there is an infection. Other medicines may be given to reduce the size of the uterus (contract) if there is a lot of bleeding.   If you have Rh negative blood and your baby was Rh positive, you will need a Rh immunoglobulin shot. This shot will protect any future baby from having Rh blood problems in future pregnancies.  HOME CARE INSTRUCTIONS    Your caregiver may order bed rest or may allow you to continue light activity. Resume activity as directed by your caregiver.   Have someone help with home and family responsibilities during this time.    Keep track of the number of sanitary pads you use each day and how soaked (saturated) they are. Write down this information.    Do not use tampons. Do not douche or have sexual intercourse until approved by your caregiver.    Only take over-the-counter or prescription medicines for pain or discomfort as directed by your caregiver.    Do not take aspirin. Aspirin can cause bleeding.    Keep all follow-up appointments with your caregiver.    If you or your partner have problems with grieving, talk to your caregiver or seek counseling to help cope with the pregnancy loss. Allow enough time to grieve before trying to get pregnant again.     SEEK IMMEDIATE MEDICAL CARE IF:    You have severe cramps or pain in your back or abdomen.   You have a fever.   You pass large blood clots (walnut-sized or larger) ortissue from your vagina. Save any tissue for your caregiver to inspect.    Your bleeding increases.    You have a thick, bad-smelling vaginal discharge.   You become lightheaded, weak, or you faint.    You have chills.   MAKE SURE YOU:   Understand these instructions.   Will watch your condition.   Will get help right away if you are not doing well or get worse.     This  information is not intended to replace advice given to you by your health care provider. Make sure you discuss any questions you have with your health care provider.     Document Released: 12/10/2000 Document Revised: 10/11/2012 Document Reviewed: 08/05/2011  Elsevier Interactive Patient Education 2016 [employer]vier Inc[employer]

## 2015-08-11 NOTE — MAU Note (Signed)
Pt presents to MAU with complaints of heavy vaginal bleeding that started early this morning around 5 with abdominal cramping throughout the night. Known AB, Ultrasound showed no cardiac activity on February the 2nd

## 2015-08-11 NOTE — MAU Note (Signed)
Called Dr. Oscar La notified patient in MAU room 10 ready for evaluation, per MD have NP evaluate her first.

## 2015-08-14 ENCOUNTER — Telehealth: Payer: Self-pay | Admitting: *Deleted

## 2015-08-14 NOTE — Telephone Encounter (Signed)
Pt called had spontaneous AB per note on 08/02/15 " I also reviewed with her if she does have a spontaneous AB that we will need to follow up with an ultrasound make sure that there is complete emptying of the uterine cavity" pt had pelvic ultrasound on 08/11/15 at hospital.   Does she need to still follow up here for ultrasound? Please advise

## 2015-08-15 NOTE — Telephone Encounter (Signed)
I spoke with patient and relayed the below and she is spotting now, asked me to relay to you  that she was given Rx for misoprostol 3 tablets at hospital was told that it did appear to be blood clot in the lining. Pt said she was told she has orthostatic blood pressure, and her WBC was elevated which they gave her Rx for that to take for 10 days. Please advise

## 2015-08-15 NOTE — Telephone Encounter (Signed)
Left message for pt to call.

## 2015-08-15 NOTE — Telephone Encounter (Signed)
Tell patient that the ultrasound showed that she did pass the pregnancy. It was a little thicker lining on measurement which could mean there is some retained tissue. It also could be just some blood clot. At this point if she is not bleeding I would follow and assuming that she has a period in 1-2 months that we can follow from there. If she does not or continues to bleed on and off then she will need another ultrasound and possibly a D&C. I cannot guarantee though at this point she does not have retained products in the other option would be to go ahead and do a ultrasound now.

## 2015-08-15 NOTE — Telephone Encounter (Signed)
Spotting ok.  Hold misoprostol.  Rest, push fluids.  If fevers, chills, or significant increased bleeding call.  Otherwise recommend OV follow up in 2 weeks

## 2015-08-15 NOTE — Telephone Encounter (Signed)
Pt informed with the below note, has completed misoprostol over the weekend, only taking antibiotic for elevated WBC. Pt will follow up in 2 weeks.

## 2015-09-06 ENCOUNTER — Telehealth: Payer: Self-pay

## 2015-09-06 NOTE — Telephone Encounter (Signed)
When I called her back she said she came by yesterday and signed a release form and picked up copy of the report. She was questioning if the line below where is states "the internal organs are tan brown, soft, friable and not separately identifiable". She asked if that was normal.  I gather that she is looking for a possible explanation for the pregnancy loss. She said this is her 2nd loss and she is starting to think something is wrong with her.  Specimen Clinical Information (kp) Gross Received in formalin are is an intact slightly macerated fetus which has a crown to rump length of 3.9 cm, and a foot length of 0.4 cm, consistent with a 9 to [redacted] week gestational age. There are no obvious external defects. On opening, the internal organs are tan brown, soft, friable and not separately identifiable

## 2015-09-06 NOTE — Telephone Encounter (Signed)
I relayed this to patient.

## 2015-09-06 NOTE — Telephone Encounter (Signed)
The little fetuses are too fragile at that stage to allow for any internal organ assessment. That is a normal reading at this gestation.

## 2015-09-06 NOTE — Telephone Encounter (Signed)
Pathology showed fetus with estimated gestational age of 9-10 weeks

## 2015-09-06 NOTE — Telephone Encounter (Signed)
Patient calling to find out about path report from 08/11/15 when she was at hospital with pregnancy loss.

## 2016-01-10 DIAGNOSIS — Z1589 Genetic susceptibility to other disease: Secondary | ICD-10-CM | POA: Insufficient documentation

## 2016-01-10 DIAGNOSIS — Z8759 Personal history of other complications of pregnancy, childbirth and the puerperium: Secondary | ICD-10-CM | POA: Insufficient documentation

## 2016-09-20 ENCOUNTER — Encounter (HOSPITAL_COMMUNITY): Payer: Self-pay

## 2016-09-20 ENCOUNTER — Inpatient Hospital Stay (HOSPITAL_COMMUNITY)
Admission: AD | Admit: 2016-09-20 | Discharge: 2016-09-20 | Disposition: A | Discharge status: Home / Self Care | Payer: Medicaid Other | Source: Ambulatory Visit | Attending: Obstetrics & Gynecology | Admitting: Obstetrics & Gynecology

## 2016-09-20 DIAGNOSIS — R51 Headache: Secondary | ICD-10-CM | POA: Diagnosis not present

## 2016-09-20 DIAGNOSIS — O909 Complication of the puerperium, unspecified: Secondary | ICD-10-CM | POA: Diagnosis present

## 2016-09-20 DIAGNOSIS — H539 Unspecified visual disturbance: Secondary | ICD-10-CM | POA: Insufficient documentation

## 2016-09-20 DIAGNOSIS — O135 Gestational [pregnancy-induced] hypertension without significant proteinuria, complicating the puerperium: Secondary | ICD-10-CM | POA: Insufficient documentation

## 2016-09-20 DIAGNOSIS — E7211 Homocystinuria: Secondary | ICD-10-CM

## 2016-09-20 DIAGNOSIS — Z8759 Personal history of other complications of pregnancy, childbirth and the puerperium: Secondary | ICD-10-CM

## 2016-09-20 HISTORY — DX: Homocystinuria: E72.11

## 2016-09-20 HISTORY — DX: Methylenetetrahydrofolate reductase deficiency: E72.12

## 2016-09-20 LAB — COMPREHENSIVE METABOLIC PANEL
ALK PHOS: 97 U/L (ref 38–126)
ALT: 29 U/L (ref 14–54)
ANION GAP: 9 (ref 5–15)
AST: 24 U/L (ref 15–41)
Albumin: 3.5 g/dL (ref 3.5–5.0)
BILIRUBIN TOTAL: 0.8 mg/dL (ref 0.3–1.2)
BUN: 16 mg/dL (ref 6–20)
CALCIUM: 9.1 mg/dL (ref 8.9–10.3)
CO2: 22 mmol/L (ref 22–32)
Chloride: 108 mmol/L (ref 101–111)
Creatinine, Ser: 0.57 mg/dL (ref 0.44–1.00)
Glucose, Bld: 76 mg/dL (ref 65–99)
POTASSIUM: 3.9 mmol/L (ref 3.5–5.1)
Sodium: 139 mmol/L (ref 135–145)
TOTAL PROTEIN: 7 g/dL (ref 6.5–8.1)

## 2016-09-20 LAB — CBC WITH DIFFERENTIAL/PLATELET
BASOS PCT: 0 %
Basophils Absolute: 0 10*3/uL (ref 0.0–0.1)
Eosinophils Absolute: 0.1 10*3/uL (ref 0.0–0.7)
Eosinophils Relative: 1 %
HEMATOCRIT: 41 % (ref 36.0–46.0)
Hemoglobin: 13.7 g/dL (ref 12.0–15.0)
LYMPHS ABS: 2.5 10*3/uL (ref 0.7–4.0)
Lymphocytes Relative: 23 %
MCH: 30.1 pg (ref 26.0–34.0)
MCHC: 33.4 g/dL (ref 30.0–36.0)
MCV: 90.1 fL (ref 78.0–100.0)
MONO ABS: 0.5 10*3/uL (ref 0.1–1.0)
MONOS PCT: 5 %
NEUTROS ABS: 7.5 10*3/uL (ref 1.7–7.7)
Neutrophils Relative %: 71 %
Platelets: 294 10*3/uL (ref 150–400)
RBC: 4.55 MIL/uL (ref 3.87–5.11)
RDW: 13.4 % (ref 11.5–15.5)
WBC: 10.8 10*3/uL — ABNORMAL HIGH (ref 4.0–10.5)

## 2016-09-20 MED ORDER — AMLODIPINE BESYLATE 5 MG PO TABS
5.0000 mg | ORAL_TABLET | Freq: Every day | ORAL | Status: DC
  Filled 2016-09-20 (×2): qty 1

## 2016-09-20 MED ORDER — HYDROCHLOROTHIAZIDE 25 MG PO TABS
25.0000 mg | ORAL_TABLET | Freq: Every day | ORAL | Status: DC
  Administered 2016-09-20: 25 mg via ORAL
  Filled 2016-09-20 (×2): qty 1

## 2016-09-20 MED ORDER — BUTALBITAL-APAP-CAFFEINE 50-325-40 MG PO TABS
2.0000 | ORAL_TABLET | Freq: Once | ORAL | Status: AC
  Administered 2016-09-20: 2 via ORAL
  Filled 2016-09-20: qty 2

## 2016-09-20 MED ORDER — AMLODIPINE BESYLATE 5 MG PO TABS: 5.0000 mg | ORAL_TABLET | Freq: Every day | ORAL | 1 refills | Status: DC

## 2016-09-20 NOTE — Discharge Instructions (Signed)
Hypertension During Pregnancy Hypertension is also called high blood pressure. High blood pressure means that the force of your blood moving in your body is too strong. When you are pregnant, this condition should be watched carefully. It can cause problems for you and your baby. Follow these instructions at home: Eating and drinking  Drink enough fluid to keep your pee (urine) clear or pale yellow.  Eat healthy foods that are low in salt (sodium). ? Do not add salt to your food. ? Check labels on foods and drinks to see much salt is in them. Look on the label where you see "Sodium." Lifestyle  Do not use any products that contain nicotine or tobacco, such as cigarettes and e-cigarettes. If you need help quitting, ask your doctor.  Do not use alcohol.  Avoid caffeine.  Avoid stress. Rest and get plenty of sleep. General instructions  Take over-the-counter and prescription medicines only as told by your doctor.  While lying down, lie on your left side. This keeps pressure off your baby.  While sitting or lying down, raise (elevate) your feet. Try putting some pillows under your lower legs.  Exercise regularly. Ask your doctor what kinds of exercise are best for you.  Keep all prenatal and follow-up visits as told by your doctor. This is important. Contact a doctor if:  You have symptoms that your doctor told you to watch for, such as: ? Fever. ? Throwing up (vomiting). ? Headache. Get help right away if:  You have very bad pain in your belly (abdomen).  You are throwing up, and this does not get better with treatment.  You suddenly get swelling in your hands, ankles, or face.  You gain 4 lb (1.8 kg) or more in 1 week.  You get bleeding from your vagina.  You have blood in your pee.  You do not feel your baby moving as much as normal.  You have a change in vision.  You have muscle twitching or sudden tightening (spasms).  You have trouble breathing.  Your lips  or fingernails turn blue. This information is not intended to replace advice given to you by your health care provider. Make sure you discuss any questions you have with your health care provider. Document Released: 07/19/2010 Document Revised: 02/26/2016 Document Reviewed: 02/26/2016 Elsevier Interactive Patient Education  2017 Elsevier Inc.  

## 2016-09-20 NOTE — MAU Note (Signed)
Pt presents with postpartum hypertension. She noticed vision changes and a headacheand took her blood pressures at home, which were high (140s/80s). Pressure 145/93 We placed her on serial blood pressures.

## 2016-09-20 NOTE — MAU Provider Note (Signed)
History    [id number] [facility], visual disturbances ans swelling. CSN: [id number]  Arrival date & time 09/20/16  1515   None     Chief Complaint  Patient presents with  . Hypertension    HPI  Past Medical History:  Diagnosis Date  . Abnormal Pap smear   . Hx of varicella   . 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 COLPOSCOPY      History reviewed. No pertinent family history.  Social History  Substance Use Topics  . Smoking status: Never Smoker  . Smokeless tobacco: Never Used  . Alcohol use 0.0 oz/week     Comment: Social    OB History    Gravida Para Term Preterm AB Living   5 4 4  0 1 4   SAB TAB Ectopic Multiple Live Births   1 0 0 0 4      Review of Systems  Constitutional: Negative.   HENT: Negative.   Eyes: Positive for visual disturbance.  Respiratory: Negative.   Cardiovascular: Negative.   Gastrointestinal: Negative.   Endocrine: Negative.   Genitourinary: Negative.   Musculoskeletal: Negative.   Skin: Negative.   Neurological: Positive for headaches.  Hematological: Negative.   Psychiatric/Behavioral: Negative.   2+ edema  DTR's 2+ no clonus  Allergies  Patient has no known allergies.  Home Medications    BP 131/81   Pulse 79   Temp 98.7 F (37.1 C) (Axillary)   Resp 18   LMP  (Approximate)   Breastfeeding? Yes   Physical Exam  Constitutional: She is oriented to person, place, and time. She appears well-developed and well-nourished.  HENT:  Head: Normocephalic.  Eyes: Pupils are equal, round, and reactive to light.  Neck: Normal range of motion.  Cardiovascular: Normal rate, regular rhythm, normal heart sounds and intact distal pulses.   Pulmonary/Chest: Effort normal and breath sounds normal.  Abdominal: Bowel sounds are normal.  Musculoskeletal: Normal  range of motion. She exhibits edema.  Neurological: She is alert and oriented to person, place, and time. She has normal reflexes.  Skin: Skin is warm and dry.  Psychiatric: She has a normal mood and affect. Her behavior is normal. Judgment and thought content normal.    MAU Course  Procedures (including critical care time)  Labs Reviewed  CBC WITH DIFFERENTIAL/PLATELET - Abnormal; Notable for the following:       Result Value   WBC 10.8 (*)    All other components within normal limits  COMPREHENSIVE METABOLIC PANEL   No results found.   1. History of gestational hypertension       MDM  BP's sl elevated. Headache relieved by fioricet, pt feeling much better. Labs stable. Pt is to f/u with OB on Wednesday for BP check. POC discussed with Dr. Penne LashLeggett. Will d/c home

## 2017-03-06 IMAGING — US US PELVIS COMPLETE
1 series · 15 of 25 positions shown · non-contrast
Comparison: 08/02/2015 ultrasound from [HOSPITAL] gynecology.

CLINICAL DATA: 34-year-old female with heavy vaginal bleeding
today. Ultrasound on 08/02/2015 demonstrating fetal demise.

EXAM:
TRANSABDOMINAL ULTRASOUND OF PELVIS
TECHNIQUE: Transabdominal ultrasound examination of the pelvis was performed
including evaluation of the uterus, ovaries, adnexal regions, and
pelvic cul-de-sac.

[Series 1: us pelvis complete · 15 of 39 slices shown]
[im 1/39]
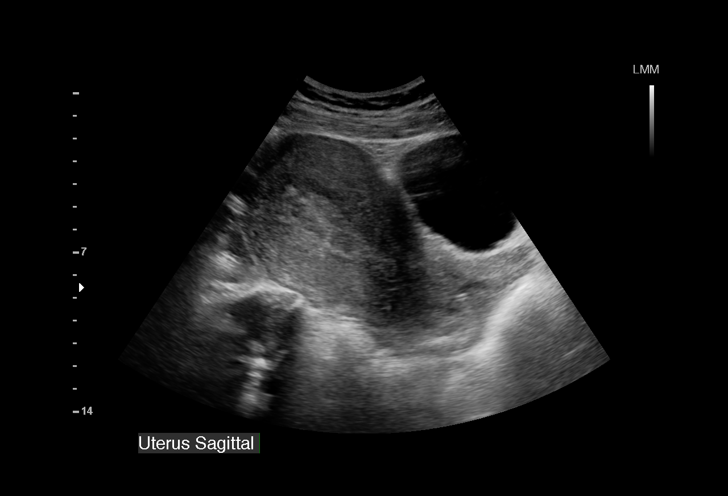
[im 4/39]
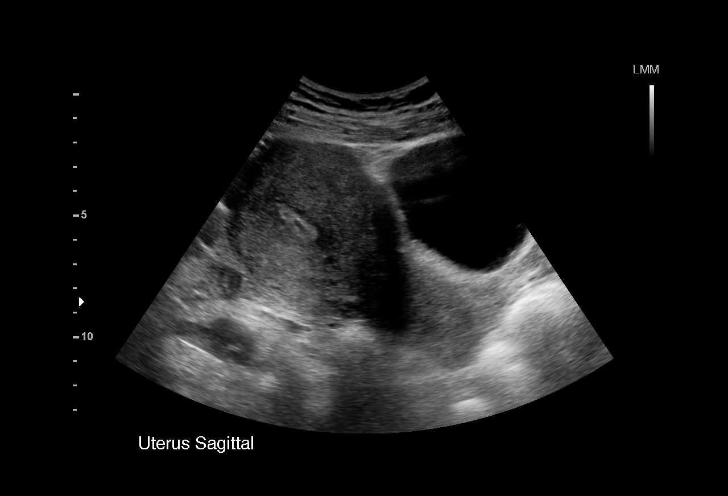
[im 7/39]
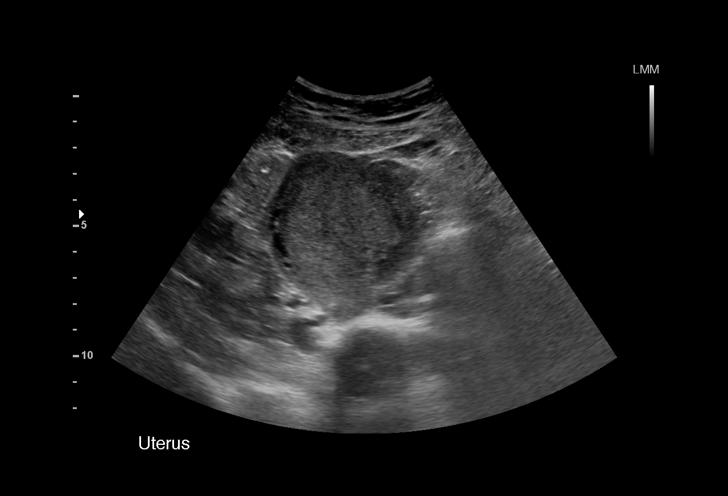
[im 8/39]
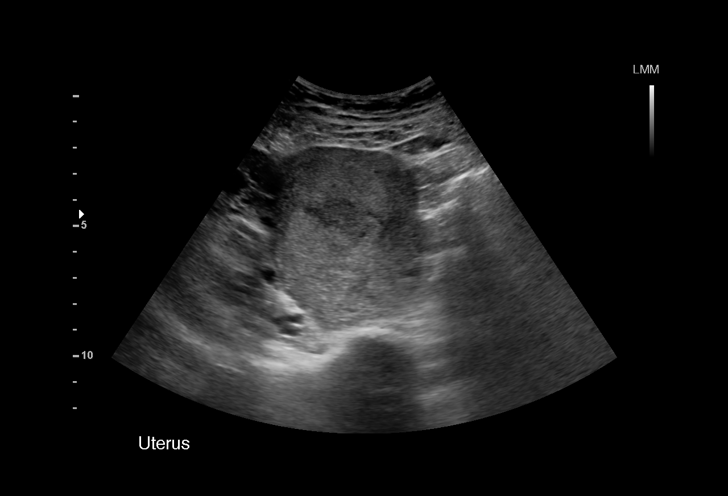
[im 12/39]
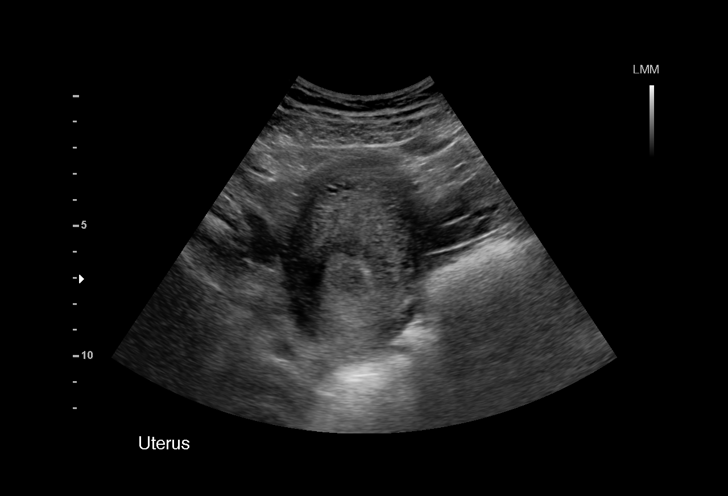
[im 15/39]
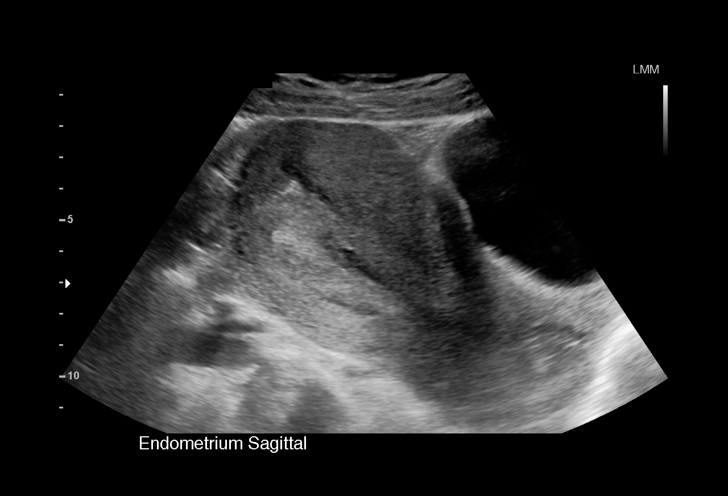
[im 16/39]
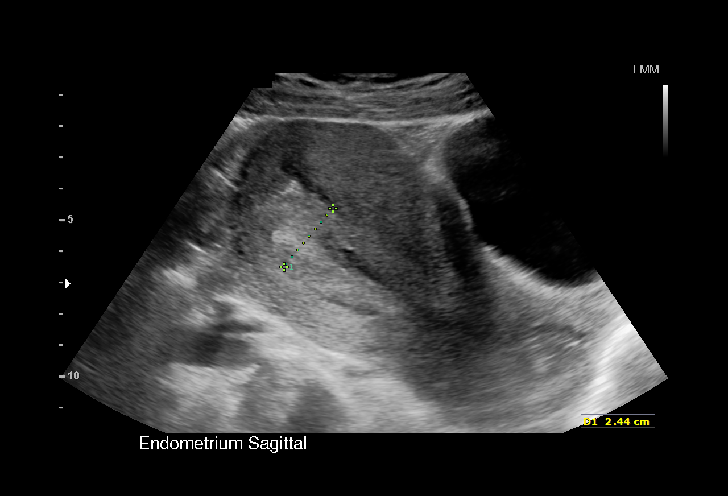
[im 20/39]
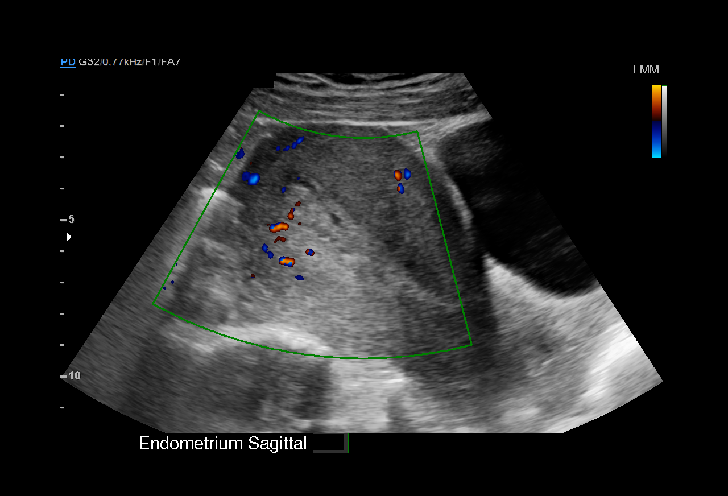
[im 23/39]
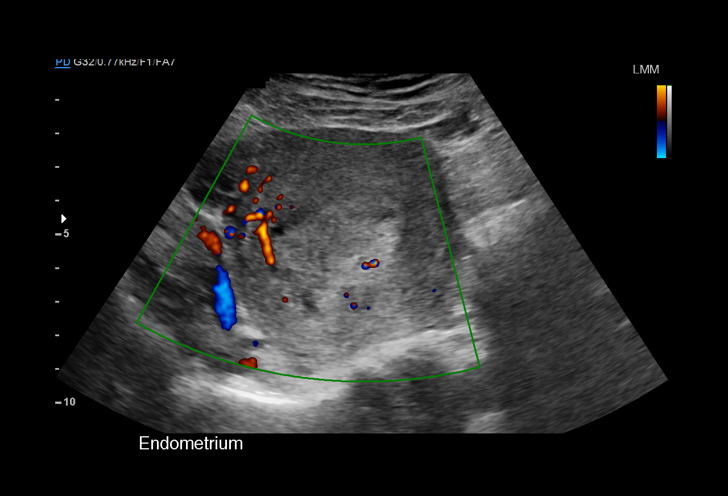
[im 24/39]
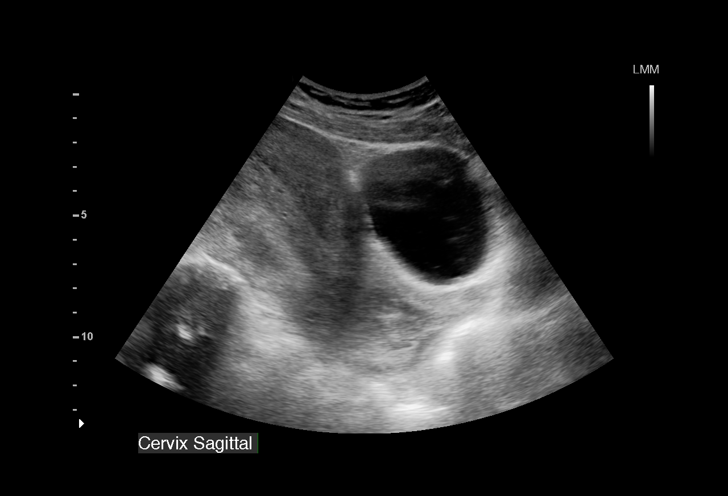
[im 27/39]
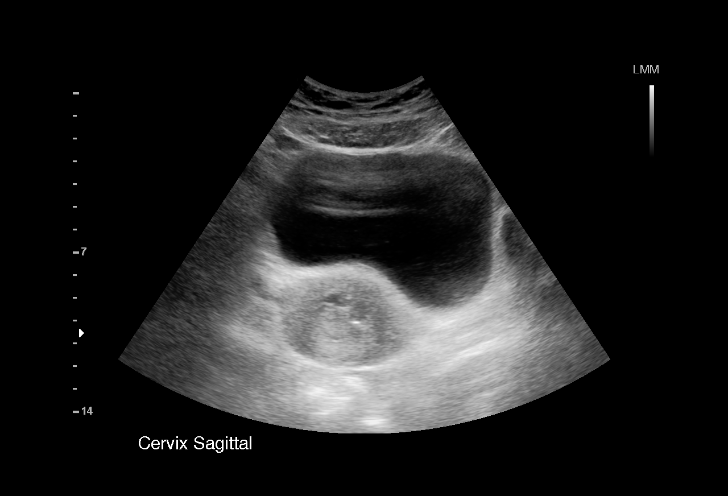
[im 31/39]
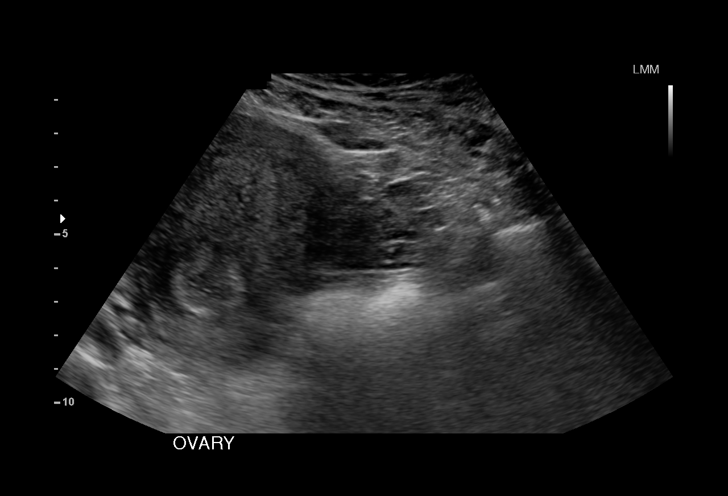
[im 32/39]
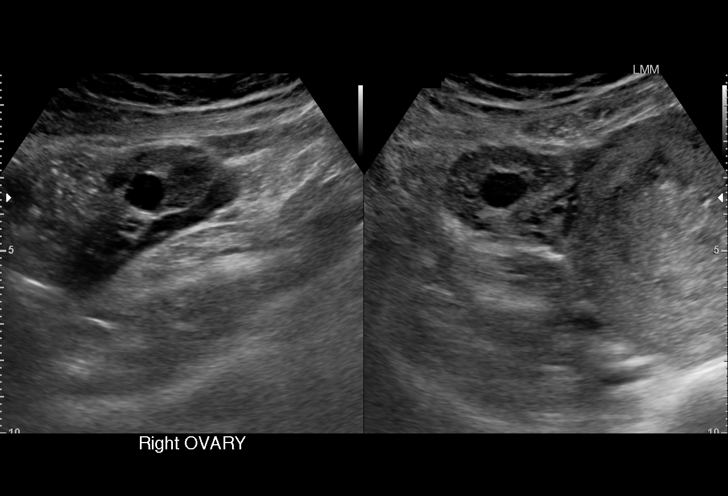
[im 35/39]
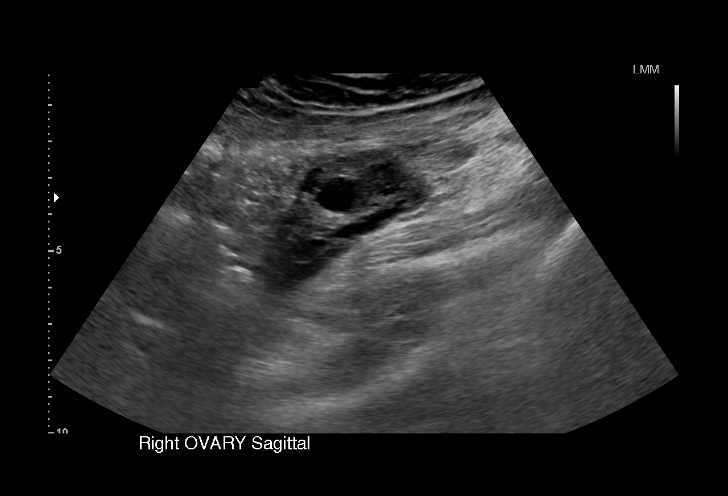
[im 39/39]
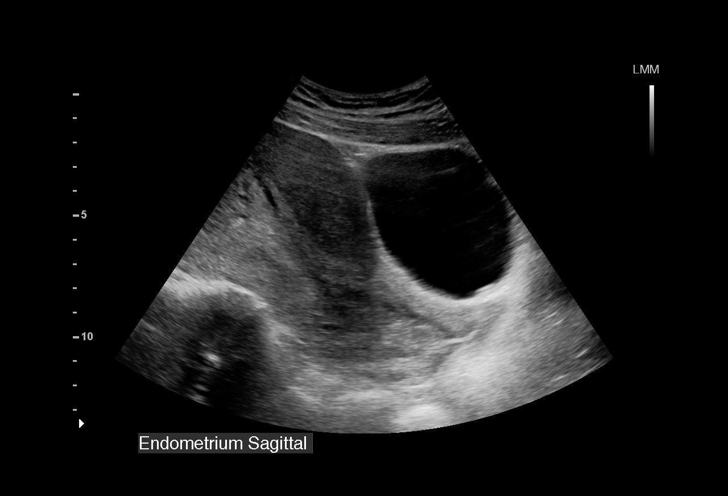

[15 of 25 positions shown; findings below may reference images not displayed]

FINDINGS: Uterus

Measurements: 11.6 x 7.1 x 5.7 cm.. No fibroids or other mass
visualized.

Endometrium

Thickness: 24 mm and heterogeneously echogenic and could represent
retained products. The fetus identified on 08/02/2015 is no longer
present.. No focal abnormality visualized.

Right ovary

Measurements: 3.5 x 2.5 x 2.8 cm. Normal appearance/no adnexal mass.

Left ovary

Measurements: 2.8 x 2.3 x 2.2 cm. Normal appearance/no adnexal mass.

Other findings:  No abnormal free fluid.
IMPRESSION: No evidence of intrauterine pregnancy and previously identified
fetus no longer present. Heterogeneously echogenic and thickened
endometrium may represent retained products.

No other significant abnormalities.

## 2019-03-22 ENCOUNTER — Encounter: Payer: Self-pay | Admitting: Gynecology

## 2022-01-07 ENCOUNTER — Inpatient Hospital Stay (HOSPITAL_COMMUNITY): Payer: BC Managed Care – PPO

## 2022-01-07 ENCOUNTER — Inpatient Hospital Stay (HOSPITAL_COMMUNITY)
Admission: AD | Admit: 2022-01-07 | Discharge: 2022-01-07 | Disposition: A | Discharge status: Home / Self Care | Payer: BC Managed Care – PPO | Attending: Obstetrics and Gynecology | Admitting: Obstetrics and Gynecology

## 2022-01-07 ENCOUNTER — Encounter (HOSPITAL_COMMUNITY): Payer: Self-pay | Admitting: Obstetrics and Gynecology

## 2022-01-07 DIAGNOSIS — O034 Incomplete spontaneous abortion without complication: Secondary | ICD-10-CM | POA: Insufficient documentation

## 2022-01-07 DIAGNOSIS — O039 Complete or unspecified spontaneous abortion without complication: Secondary | ICD-10-CM

## 2022-01-07 LAB — CBC
HCT: 39.7 % (ref 36.0–46.0)
Hemoglobin: 12.6 g/dL (ref 12.0–15.0)
MCH: 29.4 pg (ref 26.0–34.0)
MCHC: 31.7 g/dL (ref 30.0–36.0)
MCV: 92.8 fL (ref 80.0–100.0)
Platelets: 260 10*3/uL (ref 150–400)
RBC: 4.28 MIL/uL (ref 3.87–5.11)
RDW: 12.4 % (ref 11.5–15.5)
WBC: 16.3 10*3/uL — ABNORMAL HIGH (ref 4.0–10.5)
nRBC: 0 % (ref 0.0–0.2)

## 2022-01-07 MED ORDER — FENTANYL CITRATE (PF) 100 MCG/2ML IJ SOLN
50.0000 ug | Freq: Once | INTRAMUSCULAR | Status: AC
  Administered 2022-01-07: 50 ug via INTRAVENOUS
  Filled 2022-01-07: qty 2

## 2022-01-07 MED ORDER — DEXTROSE 5 % IV SOLN
200.0000 mg | INTRAVENOUS | Status: AC
  Administered 2022-01-07: 200 mg via INTRAVENOUS
  Filled 2022-01-07: qty 200

## 2022-01-07 MED ORDER — LIDOCAINE HCL (PF) 1 % IJ SOLN
INTRAMUSCULAR | Status: AC
  Administered 2022-01-07: 30 mL
  Filled 2022-01-07: qty 30

## 2022-01-07 MED ORDER — LACTATED RINGERS IV BOLUS
1000.0000 mL | Freq: Once | INTRAVENOUS | Status: AC
  Administered 2022-01-07: 1000 mL via INTRAVENOUS

## 2022-01-07 MED ORDER — ONDANSETRON HCL 4 MG/2ML IJ SOLN
4.0000 mg | Freq: Once | INTRAMUSCULAR | Status: AC
  Administered 2022-01-07: 4 mg via INTRAVENOUS
  Filled 2022-01-07: qty 2

## 2022-01-07 MED ORDER — LIDOCAINE HCL (PF) 1 % IJ SOLN: 30.0000 mL | Freq: Once | INTRAMUSCULAR | Status: AC

## 2022-01-07 MED ORDER — TRANEXAMIC ACID-NACL 1000-0.7 MG/100ML-% IV SOLN
1000.0000 mg | Freq: Once | INTRAVENOUS | Status: AC
  Administered 2022-01-07: 1000 mg via INTRAVENOUS

## 2022-01-07 MED ORDER — DOXYCYCLINE HYCLATE 100 MG PO TABS
100.0000 mg | ORAL_TABLET | Freq: Once | ORAL | Status: AC
  Administered 2022-01-07: 100 mg via ORAL
  Filled 2022-01-07: qty 1

## 2022-01-07 MED ORDER — TRANEXAMIC ACID-NACL 1000-0.7 MG/100ML-% IV SOLN
INTRAVENOUS | Status: AC
  Filled 2022-01-07: qty 100

## 2022-01-07 MED ORDER — KETOROLAC TROMETHAMINE 30 MG/ML IJ SOLN
30.0000 mg | Freq: Once | INTRAMUSCULAR | Status: AC
  Administered 2022-01-07: 30 mg via INTRAVENOUS
  Filled 2022-01-07: qty 1

## 2022-01-07 NOTE — Procedures (Signed)
   MANUAL VACUUM ASPIRATION  Location of Procedure: MAU   Hannah Holmes is 41 y.o. [id number] presenting for scheduled MVA procedure  PROCEDURE DATE: 01/07/2022  PREOPERATIVE DIAGNOSIS: 13 week by LMP diagnosed with Incomplete abortion on 01/07/22 POSTOPERATIVE DIAGNOSIS: The same PROCEDURE:   MANUAL VACUUM ASPIRATION  SURGEON:  Milas Hock   INDICATIONS: 41 y.o. [id number] with Incomplete abortion at [redacted] weeks gestation by LMP, needing surgical completion.  Risks of surgery were discussed with the patient including but not limited to: bleeding which may require transfusion; infection which may require antibiotics; injury to uterus or surrounding organs; need for additional procedures including laparotomy or laparoscopy; possibility of intrauterine scarring which may impair future fertility; and other postoperative/anesthesia complications. Written informed consent was obtained.    FINDINGS:  A 9 week size uterus, moderate amounts of products of conception, specimen sent to pathology.  ANESTHESIA: Paracervical Block 50mL 1% lidocaine ESTIMATED BLOOD LOSS:  Less than 20 ml. SPECIMENS:  Products of conception sent to pathology COMPLICATIONS:  None immediate.  PROCEDURE DETAILS:  Prior to the start of the procedure the uterus was examined using ultrasound to confirm failed pregnancy. The patient received IV  Doxycycline 200mg  after the start of the procedure after she was given one dose of TXA. She was also given Toradol, fentanyl, and Zofran.  Medications were given at least 10 minutes prior to the start of the procedure.  After an adequate timeout was performed, she was placed in the dorsal lithotomy position and examined.  A vaginal speculum was then placed in the patient's vagina and the vagina and cervix were cleaned using Betadinex3.  I numbed her cervix with 1 cc at 12 oclock of her cervix and then used the ring forcep to grasp there.  A paracervical block using 20 ml of 1% Lidocaine was  administered. I then removed all instruments to give time for the block to work (about 15 minutes). I then placed a new speculum and sterilized her cervix again with betadine x2.   The cervix was already dilated to accommodate a 9 mm suction tube. The suction curette was advanced gently advanced to the uterine fundus. The MVA apparatus was activated to create adequate suction and curette slowly rotated to clear the uterus of products of conception. A total of 3 passes performed. Ultrasound was used to assure removal of products of conception. There was minimal bleeding noted and the tenaculum removed with good hemostasis noted.   All instruments were removed from the patient's vagina.  The patient tolerated the procedure well and was observed for at least 60 minutes prior to leaving.   , MD 01/07/2022 1:58 AM [facility]

## 2022-01-07 NOTE — MAU Note (Signed)
..  Hannah Holmes is a 41 y.o. at Unknown here in MAU via EMS reporting: She was laying in her bed and felt a gush of fluid. She has had a miscarriage before but felt this was more bleeding so she called EMS. 13 weeks based on LMP per patient.  She started bleeding some on Sunday but today it increased and felt gushes.  LMP: 09/30/2021  Pain score: 5/10 cramping Vitals:   01/07/22 0057  BP: 117/63  Pulse: 79  Resp: 17  Temp: (!) 97.3 F (36.3 C)  SpO2: 100%   Lab orders placed from triage:   Upon arrival to unit the EMS stretcher sheet had blood on it and when pt ambulated to bathroom, reports she felt another gush of blood and pad was saturated. This RN asked MAU provider if it was appropriate to start bolus. Provider instructed to proceed. LR bolus and labs drawn at 0030.
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
## 2022-01-08 LAB — SURGICAL PATHOLOGY

## 2022-01-20 ENCOUNTER — Telehealth (HOSPITAL_BASED_OUTPATIENT_CLINIC_OR_DEPARTMENT_OTHER): Payer: Self-pay | Admitting: Obstetrics & Gynecology

## 2022-01-20 NOTE — Telephone Encounter (Signed)
Called patient and left a message to please call the office back to set up appointment for tomorrow .

## 2022-01-21 ENCOUNTER — Ambulatory Visit (HOSPITAL_BASED_OUTPATIENT_CLINIC_OR_DEPARTMENT_OTHER): Payer: BC Managed Care – PPO | Admitting: Obstetrics & Gynecology

## 2022-01-21 ENCOUNTER — Other Ambulatory Visit (HOSPITAL_COMMUNITY)
Admission: RE | Admit: 2022-01-21 | Discharge: 2022-01-21 | Disposition: A | Discharge status: Home / Self Care | Payer: BC Managed Care – PPO | Source: Ambulatory Visit | Attending: Obstetrics & Gynecology | Admitting: Obstetrics & Gynecology

## 2022-01-21 ENCOUNTER — Encounter (HOSPITAL_BASED_OUTPATIENT_CLINIC_OR_DEPARTMENT_OTHER): Payer: Self-pay | Admitting: Obstetrics & Gynecology

## 2022-01-21 VITALS — BP 143/84 | HR 73 | Ht 64.0 in | Wt 186.0 lb

## 2022-01-21 DIAGNOSIS — R519 Headache, unspecified: Secondary | ICD-10-CM | POA: Diagnosis not present

## 2022-01-21 DIAGNOSIS — Z124 Encounter for screening for malignant neoplasm of cervix: Secondary | ICD-10-CM

## 2022-01-21 DIAGNOSIS — O039 Complete or unspecified spontaneous abortion without complication: Secondary | ICD-10-CM

## 2022-01-21 DIAGNOSIS — Z1231 Encounter for screening mammogram for malignant neoplasm of breast: Secondary | ICD-10-CM | POA: Diagnosis not present

## 2022-01-21 DIAGNOSIS — Z8759 Personal history of other complications of pregnancy, childbirth and the puerperium: Secondary | ICD-10-CM

## 2022-01-21 DIAGNOSIS — R55 Syncope and collapse: Secondary | ICD-10-CM

## 2022-01-21 NOTE — Progress Notes (Signed)
GYNECOLOGY  VISIT  CC:   follow up after miscarriage  HPI: 41 y.o. [id number] Married White or Caucasian female here for follow up after having miscarriage on 01/07/2022.  Thought miscarriage was completed but starting having significant bleeding with passing clots  Called EMS and had only partial passage of gestational sac.  This was removed by MAU providers.  Bleeding improved significantly.  She did stop bleeding this week but was mostly just spotting for most of that time.    Was seen by Dr. Kathlen Brunswick for prior pregnancy.  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  Followed at [facility].  Has appt next week.  Is planning blood work there.    She's had several episodes of syncope where everything seems to just go dark.  She doesn't have any spinning sensation.  Sitting down more quickly will help but has fainted and bumped her head.  Concerned about possible POTS.  Would like some evaluation.  Denies SOB, palpitations, or  Has also noticed headache since miscarriage.  Wakes up with it.  Does go away with medication.  Recommended checking for anemia, iron, thyroid.  Pt states she will do at [facility] with upcoming appt.  She read somewhere that drinking caffeine before bed and help so she's tried drinking an espresso before bedtime.  Has helped a little but then causes sleep issues.    MBT O+  No recent pap smear has been done.   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     acidophilus (RISAQUAD) CAPS capsule Take 1 capsule by mouth daily. (Patient not taking: Reported on 01/21/2022)     Prenat-FeAsp-Meth-FA-DHA w/o A (PRENATE PIXIE)  10-0.6-0.4-200 MG CAPS Take 1 capsule by mouth daily. (Patient not taking: Reported on 01/21/2022)     No current facility-administered medications on file prior to visit.    ALLERGIES: Patient has no known allergies.  SH:  married, non smoker  Review of Systems  Constitutional:        Syncope episodes    PHYSICAL EXAMINATION:    BP (!) 143/84 (BP Location: Right Arm, Patient Position: Sitting, Cuff Size: Large)   Pulse 73   Ht 5\' 4"  (1.626 m) Comment: reported  Wt 186 lb (84.4 kg)   LMP 09/30/2021   BMI 31.93 kg/m     General appearance: alert, cooperative and appears stated age Neck: no adenopathy, supple, symmetrical, trachea midline and thyroid normal to inspection and palpation CV:  Regular rate and rhythm Lungs:  clear to auscultation, no wheezes, rales or rhonchi, symmetric air entry Abdomen: soft, non-tender; bowel sounds normal; no masses,  no organomegaly Lymph:  no inguinal LAD noted  Pelvic: External genitalia:  no lesions              Urethra:  normal appearing urethra with no masses, tenderness or lesions              Bartholins and Skenes: normal                 Vagina: normal appearing vagina with normal color and discharge, no lesions  Cervix: no lesions              Bimanual Exam:  Uterus:  normal size, contour, position, consistency, mobility, non-tender              Adnexa: no mass, fullness, tenderness            Chaperone, Ina Homes, CMA, was present for exam.  Assessment/Plan: 1. Miscarriage - normal exam today - pt declines any contraception but is not interested in having another pregnacy  2. Cervical cancer screening - Cytology - PAP( [place])  3. Encounter for screening mammogram for malignant neoplasm of breast - guidelines reviewed.  Information given and order placed. - MM 3D SCREEN BREAST BILATERAL; Future  4. Syncope and collapse - possible POTS.  Recommended obtaining some blood work today.  Pt declines.  Is going  to do this with [facility].  5.  Headaches since miscarriage - recommended testing for anemia and iron.  Will do this as well with [facility].

## 2022-01-21 NOTE — Patient Instructions (Signed)
Hba1c Cmp--metabolic panel Thyroid test is up to date Iron and hemoglobin

## 2022-01-23 DIAGNOSIS — R55 Syncope and collapse: Secondary | ICD-10-CM | POA: Insufficient documentation

## 2022-01-27 LAB — CYTOLOGY - PAP
Comment: NEGATIVE
Diagnosis: NEGATIVE
High risk HPV: NEGATIVE
